# Patient Record
Sex: Male | Born: 1990 | Race: White | Hispanic: No | Marital: Married | Smoking: Current every day smoker
Health system: Southern US, Community
[De-identification: ages and names within clinical notes are randomized; demographics above are authoritative.]

## PROBLEM LIST (undated history)

## (undated) DIAGNOSIS — G43909 Migraine, unspecified, not intractable, without status migrainosus: Secondary | ICD-10-CM

## (undated) DIAGNOSIS — F17200 Nicotine dependence, unspecified, uncomplicated: Secondary | ICD-10-CM

## (undated) DIAGNOSIS — R000 Tachycardia, unspecified: Secondary | ICD-10-CM

## (undated) DIAGNOSIS — IMO0001 Reserved for inherently not codable concepts without codable children: Secondary | ICD-10-CM

## (undated) HISTORY — DX: Migraine, unspecified, not intractable, without status migrainosus: G43.909

## (undated) HISTORY — DX: Tachycardia, unspecified: R00.0

## (undated) HISTORY — DX: Nicotine dependence, unspecified, uncomplicated: F17.200

## (undated) HISTORY — DX: Reserved for inherently not codable concepts without codable children: IMO0001

---
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
## 2009-09-26 ENCOUNTER — Encounter: Payer: Self-pay | Admitting: Orthopedic Surgery

## 2009-09-26 ENCOUNTER — Emergency Department (HOSPITAL_COMMUNITY): Admission: EM | Admit: 2009-09-26 | Discharge: 2009-09-26 | Payer: Self-pay | Admitting: Emergency Medicine

## 2009-10-05 ENCOUNTER — Ambulatory Visit: Payer: Self-pay | Admitting: Orthopedic Surgery

## 2009-10-05 DIAGNOSIS — S8263XA Displaced fracture of lateral malleolus of unspecified fibula, initial encounter for closed fracture: Secondary | ICD-10-CM | POA: Insufficient documentation

## 2009-10-24 ENCOUNTER — Ambulatory Visit (HOSPITAL_COMMUNITY): Admission: RE | Admit: 2009-10-24 | Discharge: 2009-10-24 | Payer: Self-pay | Admitting: Orthopedic Surgery

## 2009-10-24 ENCOUNTER — Ambulatory Visit: Payer: Self-pay | Admitting: Orthopedic Surgery

## 2009-10-31 ENCOUNTER — Telehealth: Payer: Self-pay | Admitting: Orthopedic Surgery

## 2009-10-31 ENCOUNTER — Encounter: Payer: Self-pay | Admitting: Orthopedic Surgery

## 2009-11-21 ENCOUNTER — Ambulatory Visit: Payer: Self-pay | Admitting: Orthopedic Surgery

## 2010-05-02 NOTE — Assessment & Plan Note (Signed)
Summary: 4 WK RE-CK/XRAY LT ANKLE/BCBS/CAF   Visit Type:  Follow-up Referring Provider:  [facility]  CC:  left ankle fracture.  History of Present Illness: 20 year old male LEFT ankle fracture lateral malleolus  Treatment cast  Date of injury June 25  Patient's been in cast for 8 weeks  Today scheduled for x-rays  Current medications none  Xrays today.  clinical exam he is ambulatory with no limp he has no tenderness over the fracture there is no swelling.  Ankle range of motion is normal.  Plantar flexion dorsiflexion strength is normal.  His ankle is stable.  Has normal perfusion and ankle.  X-rays 3 views of the ankle fracture line is barely visible.  The mortise is intact.  Impression healed fracture( 95%)  Patient returned to work patient can remove air cast  Allergies: No Known Drug Allergies   Impression & Recommendations:  Problem # 1:  CLOSED FRACTURE OF LATERAL MALLEOLUS (ICD-824.2) Assessment Improved  Orders: Post-Op Check (84132) Ankle x-ray complete,  minimum 3 views (44010)  Problem # 2:  AFTERCARE HEALING TRAUMATIC FRACTURE LEG UNSPEC (ICD-V54.14) Assessment: Improved  Orders: Post-Op Check (27253) Ankle x-ray complete,  minimum 3 views (66440)  Patient Instructions: 1)  Please schedule a follow-up appointment as needed.
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
## 2010-05-02 NOTE — Letter (Signed)
Summary: History form  History form   Imported By: Jacklynn Ganong 10/07/2009 07:34:26  _____________________________________________________________________  External Attachment:    Type:   Image     Comment:   External Document
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
## 2010-05-02 NOTE — Progress Notes (Signed)
Summary: echo results  Phone Note Call from Patient Call back at [phone]   Caller: Patient Reason for Call: Talk to Nurse, Lab or Test Results Summary of Call: echo results 11-15-2008 Initial call taken by: Burnard Leigh,  November 22, 2008 4:27 PM  Follow-up for Phone Call        NOTE MAILED TO PT WITH ECHO RESULTS Scherrie Bateman, LPN  November 22, 2008 4:55 PM

## 2010-05-02 NOTE — Letter (Signed)
Summary: Out of Work  [facility]  [address]  [place], [place] [address]   Phone: [phone]  Fax: [phone]    November 21, 2009   Employee:  Christopher [name] [facility]    To Whom It May Concern:   [name] can return to work tomorrow Aug 23rd    Sincerely,    Fuller Canada MD

## 2010-05-02 NOTE — Letter (Signed)
Summary: Out of Work  [facility]  [address]  [place], [place] [address]   Phone: [phone]  Fax: [phone]    November 21, 2009   Employee:  Christopher [name] [employer]    To Whom It May Concern:   For Medical reasons, please excuse the above named employee from work for the following dates:  Start:    End:    If you need additional information, please feel free to contact our office.         Sincerely,    Fuller Canada MD

## 2010-05-02 NOTE — Assessment & Plan Note (Signed)
Summary: RE-CK/XRAYS LT ANKLE/FX CARE/BCBS?POSS NEW CARD/CAF   Visit Type:  Follow-up Referring Provider:  [facility]  CC:  fx care left ankle.  History of Present Illness: I saw Christopher [name] in the office today for an initial visit.  He is a 20 years old man with the complaint of:  left ankle fracture  DOI 09-24-09.  [facility] on [facility]. Xrays done there.  Medications: Ibuprofen 800mg , Hydrocodone 5/325  still complains of some pain in his ankle and also in his RIGHT hand I reviewed the x-rays of the RIGHT hand her negative  His ankle is doing well some mild discomfort laterally  Allergies: No Known Drug Allergies   Impression & Recommendations:  Problem # 1:  CLOSED FRACTURE OF LATERAL MALLEOLUS (ICD-824.2) Assessment Improved  new cell phone number:[phone]  X-rays of the LEFT ankle at [facility] show a healing fibula fracture, I called him with his result  Continue  Continue Aircast  Orders: Post-Op Check (09811)  Patient Instructions: 1)  4 weeks xray left ankle

## 2010-05-02 NOTE — Assessment & Plan Note (Signed)
Summary: [facility]   History of Present Illness: Christopher [name] is seen today at the request of the [facility] ER.  He was seen there on 7/27.  He had SSCP with palpitations.  Apparantly EMS idicated a HR of 200bpm.  The rythm spontaneously broke and was not documented.  The patient smokes 1ppd and had had 2-3 Red Bulls before the episode.  He is a Naval architect.  He denies other drugs or stiumlants.  He has not had a previos episodeo of palpitations or heart arrythmia.  There is no history of congenital heart disease, dyspne, syncope, or abnormal ECG.  There is no family history of WPW, HOCM, syncope, long QT or other cardiac problems.  I counseled the patient for less than 10 minutes on smoking cessations and suggested setting a quit date at the same time as his Christopher [name] who also smokes.  His "SVT" lasted about 45 minutes and was associated with right sided SSCP.  The pain was immediatly relieved when his heart rate normalized.  I reviewed his ER records.  Lab work was normal, tox screen negative, CXR normal with no cardiomegaly and normal ECG.  Since he has not had a previous episode and this one seems to have been precipitated by [employer] I don't think he needs a monitor or EPS evaluaiton  Current Problems (verified): None  Current Medications (verified): 1)  Imitrex 50 Mg Tabs (Sumatriptan Succinate) .... As Needed Migraines 2)  Vicodin 5-500 Mg Tabs (Hydrocodone-Acetaminophen) .... 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 Gets migrains on a weekly basis All other systems reviewed and negative   Vital Signs:  Patient profile:   20 year old male Pulse rate:   58 / minute Pulse rhythm:   regular Resp:     12 per minute BP supine:   120 / 68  Physical Exam  General:  Affect appropriate Healthy:  appears stated age HEENT: normal Neck supple with no adenopathy JVP normal no bruits no thyromegaly Lungs clear with no wheezing and good diaphragmatic motion Heart:  S1/S2 no murmur,rub, gallop or click PMI normal Abdomen: benighn, BS positve, no tenderness, no AAA no bruit.  No HSM or HJR Distal pulses intact with no bruits No edema Neuro non-focal Skin warm and dry Tatoos on back and right shoulder   EKG  Procedure date:  11/03/2008  Findings:      Sinus bradycardia 58 Minimal Voltage for LVH Borderline ECG PR on short side  Impression & Recommendations:  Problem # 1:  PALPITATIONS (ICD-785.1) ? episode of PSVT ppt by nicotine and caffien.  Avoid multiple cans of Red Bull.  Check echo to R/O structural heart disease  Problem # 2:  SMOKER (ICD-305.1) Recommend quit date with mom and 14mg  patch.    Problem # 3:  CHEST PAIN UNSPECIFIED (ICD-786.50) Lilkely related to PSVT.  F/u treadmill to R/O anomalous cors and exercised induced arrythmia  Problem # 4:  MENST MIGRAINE W/INTRACTBL W/STATUS MIGRAINOSUS (ICD-346.43) Recommend that he be  referred to neurologist given his frequency His updated medication list for this problem includes:    Imitrex 50 Mg Tabs (Sumatriptan succinate) .Marland Kitchen... As needed migraines    Vicodin 5-500 Mg Tabs (Hydrocodone-acetaminophen) .Marland Kitchen... As needed

## 2010-05-02 NOTE — Letter (Signed)
Summary: Xray order LT ankle  Xray order LT ankle   Imported By: Cammie Sickle 10/25/2009 11:04:52  _____________________________________________________________________  External Attachment:    Type:   Image     Comment:   External Document

## 2010-05-02 NOTE — Letter (Signed)
Summary: Generic Letter  [facility], Main Office  [address]   [place], [place] [address]   Phone: [phone]  Fax: [phone]        November 22, 2008 MRN: [id number]    [facility] [address] [place], [place]  [address]    Dear Christopher [name],            The ultrasound you had in our office was normal. That means the heart pumping function and the heart structure is normal. Please call with questions or concerns.     Sincerely,  Deliah Goody, RN/Dr Charlton Haws  This letter has been electronically signed by your physician.

## 2010-05-02 NOTE — Progress Notes (Signed)
  Patient called here today and was asking for a Back to Work/School Letter I printed one off got Rene Kocher FLowers to sign it and faxed to the patient @ [phone] [facility] # for patient [phone]  [facility]  November 15, 2008 1:14 PM

## 2010-05-02 NOTE — Letter (Signed)
Summary: [facility]  [address]  [place], [place] [address]   Phone: [phone]  Fax: [phone]    Today's Date: October 05, 2009  Name of Patient: Christopher [name]  The above named patient had a medical visit in our office today.  Please take this into consideration when reviewing the time away from work.   Special Instructions:      Arly.Keller ] To be off as follows:   START DATE:                     09/26/09  END/RETURN TO WORK:  10/10/09, Light duty as stated  [ X ] Other * LIGHT DUTY WORK, must sit  Next appointment on 10/24/09 ________________________________________________________________________   Sincerely,   Terrance Mass, MD

## 2010-07-09 LAB — RAPID URINE DRUG SCREEN, HOSP PERFORMED
Amphetamines: NOT DETECTED
Barbiturates: NOT DETECTED
Benzodiazepines: NOT DETECTED
Cocaine: NOT DETECTED
Opiates: POSITIVE — AB
Tetrahydrocannabinol: NOT DETECTED

## 2010-07-09 LAB — BASIC METABOLIC PANEL
BUN: 8 mg/dL (ref 6–23)
CO2: 30 mEq/L (ref 19–32)
Calcium: 9.8 mg/dL (ref 8.4–10.5)
Chloride: 105 mEq/L (ref 96–112)
Creatinine, Ser: 0.83 mg/dL (ref 0.4–1.5)
GFR calc Af Amer: >60 mL/min (ref >60)
GFR calc non Af Amer: >60 mL/min (ref >60)
Glucose, Bld: 110 mg/dL — ABNORMAL HIGH (ref 70–99)
Potassium: 3.4 mEq/L — ABNORMAL LOW (ref 3.5–5.1)
Sodium: 139 mEq/L (ref 135–145)

## 2010-07-09 LAB — TROPONIN I: Troponin I: 0.02 ng/mL (ref 0.00–0.06)

## 2010-07-09 LAB — CBC
HCT: 40.9 % (ref 39.0–52.0)
Hemoglobin: 14.6 g/dL (ref 13.0–17.0)
MCHC: 35.7 g/dL (ref 30.0–36.0)
MCV: 84.8 fL (ref 78.0–100.0)
Platelets: 199 10*3/uL (ref 150–400)
RBC: 4.82 MIL/uL (ref 4.22–5.81)
RDW: 13.6 % (ref 11.5–15.5)
WBC: 10.7 10*3/uL — ABNORMAL HIGH (ref 4.0–10.5)

## 2010-07-09 LAB — MAGNESIUM: Magnesium: 2.1 mg/dL (ref 1.5–2.5)

## 2010-07-09 LAB — POCT CARDIAC MARKERS
CKMB, poc: <1 ng/mL — ABNORMAL LOW (ref 1.0–8.0)
Myoglobin, poc: 35.9 ng/mL (ref 12–200)
Troponin i, poc: <0.05 ng/mL (ref 0.00–0.09)

## 2010-07-09 LAB — CK TOTAL AND CKMB (NOT AT ARMC)
CK, MB: 0.6 ng/mL (ref 0.3–4.0)
Relative Index: 0.3 (ref 0.0–2.5)
Total CK: 174 U/L (ref 7–232)

## 2010-07-09 LAB — ETHANOL: Alcohol, Ethyl (B): <5 mg/dL (ref 0–10)

## 2011-02-26 ENCOUNTER — Encounter: Payer: Self-pay | Admitting: Cardiovascular Disease
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
## 2014-02-22 ENCOUNTER — Encounter (HOSPITAL_COMMUNITY): Payer: Self-pay

## 2014-02-22 ENCOUNTER — Emergency Department (HOSPITAL_COMMUNITY): Payer: Worker's Compensation

## 2014-02-22 ENCOUNTER — Emergency Department (HOSPITAL_COMMUNITY)
Admission: EM | Admit: 2014-02-22 | Discharge: 2014-02-22 | Disposition: A | Discharge status: Home / Self Care | Payer: Worker's Compensation | Attending: Emergency Medicine | Admitting: Emergency Medicine

## 2014-02-22 DIAGNOSIS — T1490XA Injury, unspecified, initial encounter: Secondary | ICD-10-CM

## 2014-02-22 DIAGNOSIS — S61215A Laceration without foreign body of left ring finger without damage to nail, initial encounter: Secondary | ICD-10-CM | POA: Insufficient documentation

## 2014-02-22 DIAGNOSIS — Y9389 Activity, other specified: Secondary | ICD-10-CM | POA: Insufficient documentation

## 2014-02-22 DIAGNOSIS — G43909 Migraine, unspecified, not intractable, without status migrainosus: Secondary | ICD-10-CM | POA: Diagnosis not present

## 2014-02-22 DIAGNOSIS — Y9289 Other specified places as the place of occurrence of the external cause: Secondary | ICD-10-CM | POA: Diagnosis not present

## 2014-02-22 DIAGNOSIS — Y990 Civilian activity done for income or pay: Secondary | ICD-10-CM | POA: Diagnosis not present

## 2014-02-22 DIAGNOSIS — Z720 Tobacco use: Secondary | ICD-10-CM | POA: Insufficient documentation

## 2014-02-22 DIAGNOSIS — W230XXA Caught, crushed, jammed, or pinched between moving objects, initial encounter: Secondary | ICD-10-CM | POA: Insufficient documentation

## 2014-02-22 DIAGNOSIS — S61219A Laceration without foreign body of unspecified finger without damage to nail, initial encounter: Secondary | ICD-10-CM

## 2014-02-22 DIAGNOSIS — S61213A Laceration without foreign body of left middle finger without damage to nail, initial encounter: Secondary | ICD-10-CM | POA: Insufficient documentation

## 2014-02-22 DIAGNOSIS — Z23 Encounter for immunization: Secondary | ICD-10-CM | POA: Diagnosis not present

## 2014-02-22 MED ORDER — CEPHALEXIN 500 MG PO CAPS: 500.0000 mg | ORAL_CAPSULE | Freq: Four times a day (QID) | ORAL | Status: DC

## 2014-02-22 MED ORDER — TETANUS-DIPHTH-ACELL PERTUSSIS 5-2.5-18.5 LF-MCG/0.5 IM SUSP
0.5000 mL | Freq: Once | INTRAMUSCULAR | Status: AC
  Administered 2014-02-22: 0.5 mL via INTRAMUSCULAR
  Filled 2014-02-22: qty 0.5

## 2014-02-22 NOTE — ED Provider Notes (Signed)
CSN: [id number]     Arrival date & time 02/22/14  0820 History   First MD Initiated Contact with Patient 02/22/14 [phone]     Chief Complaint  Patient presents with  . Laceration     (Consider location/radiation/quality/duration/timing/severity/associated sxs/prior Treatment) HPI   Christopher [name] is a 23 y.o. male who presents to the Emergency Department complaining of laceration to the left middle and ring fingers that occurred at his place of work. He states that he was using a piece of equipment when his fingers became lodged between 2 pieces of metal. He reports a tingling sensation to his left middle finger. He has not tried any therapies or cleaning of the wound prior to ER arrival. He denies swelling, inability to flex or extend his fingers, or pain to his hand or wrist. Last tetanus vaccination is unknown.   Past Medical History  Diagnosis Date  . Smoking   . Migraines   . Tachycardia     ER visit 10/26/2008, heart rate 200 not documented   History reviewed. No pertinent past surgical history. Family History  Problem Relation Age of Onset  . Heart attack Father    History  Substance Use Topics  . Smoking status: Current Every Day Smoker -- 1.00 packs/day  . Smokeless tobacco: Not on file  . Alcohol Use: No    Review of Systems  Constitutional: Negative for fever and chills.  Musculoskeletal: Negative for back pain, joint swelling and arthralgias.  Skin: Positive for wound.       Laceration   Neurological: Negative for dizziness, weakness and numbness.  Hematological: Does not bruise/bleed easily.  All other systems reviewed and are negative.     Allergies  Review of patient's allergies indicates no known allergies.  Home Medications   Prior to Admission medications   Medication Sig Start Date End Date Taking? Authorizing Provider  HYDROcodone-acetaminophen (NORCO) 5-325 MG per tablet Take 1 tablet by mouth every 4 (four) hours as needed.      Historical  Provider, MD  HYDROcodone-acetaminophen (VICODIN) 5-500 MG per tablet Take 1 tablet by mouth as needed.      Historical Provider, MD  SUMAtriptan (IMITREX) 50 MG tablet Take 50 mg by mouth as needed.      Historical Provider, MD   BP 119/79 mmHg  Pulse 66  Temp(Src) 97.9 F (36.6 C) (Oral)  Resp 20  Ht 6' (1.829 m)  Wt 175 lb (79.379 kg)  BMI 23.73 kg/m2  SpO2 100% Physical Exam  Constitutional: He is oriented to person, place, and time. He appears well-developed and well-nourished. No distress.  HENT:  Head: Normocephalic and atraumatic.  Cardiovascular: Normal rate, regular rhythm, normal heart sounds and intact distal pulses.   No murmur heard. Pulmonary/Chest: Effort normal and breath sounds normal. No respiratory distress.  Musculoskeletal: He exhibits no edema or tenderness.  Neurological: He is alert and oriented to person, place, and time. He exhibits normal muscle tone. Coordination normal.  Skin: Skin is warm. Laceration noted.  11/2 cm laceration to the dorsal left middle finger. Bleeding is controlled. There is also a superficial laceration to the medial aspect of the left ring finger. Patient has full range of motion of all the fingers, distal sensation and radial pulse intact. Cap refill less than 2 seconds. No edema  Nursing note and vitals reviewed.   ED Course  Procedures (including critical care time) Labs Review Labs Reviewed - No data to display  Imaging Review Dg Hand Complete Left  02/22/2014   CLINICAL DATA:  Fingers were cut in a machine.  Injury.  EXAM: LEFT HAND - COMPLETE 3+ VIEW  COMPARISON:  None.  FINDINGS: Negative for a fracture or dislocation. No evidence for a radiopaque foreign body. Alignment of the left hand is normal.  IMPRESSION: No acute bone abnormality.   Electronically Signed   By: Richarda OverlieAdam  Henn M.D.   On: 02/22/2014 08:48     EKG Interpretation None      MDM   Final diagnoses:  Finger laceration, initial encounter    Patient  with 2 small lacerations to the left fingers, neurovascularly intact, patient has full range of motion. No concerning symptoms for muscle tendon or nerve injury.  TD updated. Patient requests that the lacerations not be closed .  I advised patient of risk for infection, he verbalizes understanding and agrees to return for any worsening symptoms.  Wounds were cleaned and bandages applied.  Rx for keflex given prophylactically  Aydin Cavalieri L. Trisha Mangleriplett, PA-C 02/22/14 0915  Donnetta HutchingBrian Cook, MD 02/22/14 1258

## 2014-02-22 NOTE — ED Notes (Signed)
Pt reports was working on a machine at work and the machine turned on and cut left middle finger in 2 places and small place on ring finger and abrasion on index finger.  Bleeding controlled.  Capillary refill wnl and pt has sensation to all fingers.  Reports tingling sensation in left middle finger.

## 2014-02-22 NOTE — Discharge Instructions (Signed)

## 2014-07-20 ENCOUNTER — Ambulatory Visit (INDEPENDENT_AMBULATORY_CARE_PROVIDER_SITE_OTHER): Payer: 59 | Admitting: Family Medicine

## 2014-07-20 ENCOUNTER — Encounter: Payer: Self-pay | Admitting: Family Medicine

## 2014-07-20 VITALS — BP 122/80 | Temp 98.2°F | Ht 73.0 in | Wt 181.0 lb

## 2014-07-20 DIAGNOSIS — R000 Tachycardia, unspecified: Secondary | ICD-10-CM | POA: Diagnosis not present
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
## 2014-08-03 ENCOUNTER — Encounter: Payer: Self-pay | Admitting: Internal Medicine

## 2014-08-03 ENCOUNTER — Ambulatory Visit (INDEPENDENT_AMBULATORY_CARE_PROVIDER_SITE_OTHER): Payer: 59 | Admitting: Internal Medicine

## 2014-08-03 VITALS — BP 116/70 | HR 76 | Ht 73.0 in | Wt 184.0 lb

## 2014-08-03 DIAGNOSIS — R002 Palpitations: Secondary | ICD-10-CM | POA: Diagnosis not present

## 2014-08-03 DIAGNOSIS — F17200 Nicotine dependence, unspecified, uncomplicated: Secondary | ICD-10-CM

## 2014-08-03 DIAGNOSIS — Z720 Tobacco use: Secondary | ICD-10-CM

## 2014-08-03 NOTE — Patient Instructions (Signed)
Medication Instructions:  None ordered  Labwork: None ordered  Testing/Procedures: None ordered  Follow-Up: Your physician recommends that you schedule a follow-up appointment as needed   Any Other Special Instructions Will Be Listed Below (If Applicable).  Please try and capture SVT on monitor Call if you are able to do so

## 2014-08-04 NOTE — Assessment & Plan Note (Signed)
We discussed the importance of smoking cessation. He has recently started using smokeless tobacco instead of cigarettes.

## 2014-08-04 NOTE — Assessment & Plan Note (Signed)
The etiology of his palpitations is unclear. He notes that his palpitations have improved with stopping caffeine. We discussed the possible etiologies. He could have SVT. As he works as a Company secretaryfireman, I have asked the patient to have a cardiac rhythm strip obtained when he feels like his heart is racing. If he does have SVT then medical therapy would first be indicated. We briefly discussed catheter ablation as well.
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
## 2014-08-11 ENCOUNTER — Encounter: Payer: Self-pay | Admitting: Family Medicine

## 2014-10-25 ENCOUNTER — Encounter: Payer: Self-pay | Admitting: Family Medicine

## 2014-10-25 ENCOUNTER — Ambulatory Visit (INDEPENDENT_AMBULATORY_CARE_PROVIDER_SITE_OTHER): Payer: 59 | Admitting: Family Medicine

## 2014-10-25 VITALS — BP 130/88 | Temp 98.2°F | Ht 73.0 in | Wt 189.0 lb

## 2014-10-25 DIAGNOSIS — H0016 Chalazion left eye, unspecified eyelid: Secondary | ICD-10-CM

## 2014-10-25 MED ORDER — DOXYCYCLINE HYCLATE 100 MG PO CAPS: 100.0000 mg | ORAL_CAPSULE | Freq: Two times a day (BID) | ORAL | Status: DC

## 2014-10-25 NOTE — Progress Notes (Signed)
   Subjective:    Patient ID: Christopher [name], male    DOB: 09-Jun-1990, 24 y.o.   MRN: [id number]  Eye Pain  The left eye is affected. This is a new problem. The current episode started in the past 7 days. The problem occurs constantly. The problem has been unchanged. There was no injury mechanism. The pain is moderate. Associated symptoms include eye redness. Associated symptoms comments: Bump on eyelid. He has tried water for the symptoms. The treatment provided no relief.  Patient has no other concerns at this time.     Review of Systems  Eyes: Positive for pain and redness.   No fever relates some headaches with it no vomiting    Objective:   Physical Exam  This appears to be a small infected gland on the eyelid I find no evidence of orbital cellulitis      Assessment & Plan:  Regional eyelid infection antibiotics warm compresses if not improving over the next 2-3 days referral to ophthalmology for drainage

## 2015-01-25 ENCOUNTER — Encounter (HOSPITAL_COMMUNITY): Payer: Self-pay | Admitting: Emergency Medicine

## 2015-01-25 ENCOUNTER — Emergency Department (HOSPITAL_COMMUNITY)
Admission: EM | Admit: 2015-01-25 | Discharge: 2015-01-26 | Disposition: A | Discharge status: Home / Self Care | Payer: Worker's Compensation | Attending: Emergency Medicine | Admitting: Emergency Medicine

## 2015-01-25 DIAGNOSIS — F17210 Nicotine dependence, cigarettes, uncomplicated: Secondary | ICD-10-CM | POA: Insufficient documentation

## 2015-01-25 DIAGNOSIS — Z77098 Contact with and (suspected) exposure to other hazardous, chiefly nonmedicinal, chemicals: Secondary | ICD-10-CM | POA: Diagnosis present

## 2015-01-25 DIAGNOSIS — Z139 Encounter for screening, unspecified: Secondary | ICD-10-CM

## 2015-01-25 DIAGNOSIS — Z008 Encounter for other general examination: Secondary | ICD-10-CM | POA: Insufficient documentation

## 2015-01-25 DIAGNOSIS — Z8679 Personal history of other diseases of the circulatory system: Secondary | ICD-10-CM | POA: Diagnosis not present

## 2015-01-25 DIAGNOSIS — Z792 Long term (current) use of antibiotics: Secondary | ICD-10-CM | POA: Diagnosis not present
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
## 2015-01-26 ENCOUNTER — Emergency Department (HOSPITAL_COMMUNITY): Payer: Worker's Compensation

## 2015-01-26 NOTE — Discharge Instructions (Signed)
Medical Screening Exam °A medical screening exam has been done. This exam helps find the cause of your problem and determines whether you need emergency treatment. Your exam has shown that you do not need emergency treatment at this point. It is safe for you to go to your caregiver's office or clinic for treatment. You should make an appointment today to see your caregiver as soon as he or she is available. °Depending on your illness, your symptoms and condition can change over time. If your condition gets worse or you develop new or troubling symptoms before you see your caregiver, you should return to the emergency department for further evaluation.  °  °This information is not intended to replace advice given to you by your health care provider. Make sure you discuss any questions you have with your health care provider. °  °Document Released: 04/26/2004 Document Revised: 04/09/2014 Document Reviewed: 12/06/2010 °Elsevier Interactive Patient Education ©2016 [employer] ° °

## 2015-01-26 NOTE — ED Notes (Signed)
Pt transported to radiology.

## 2015-08-26 ENCOUNTER — Ambulatory Visit (INDEPENDENT_AMBULATORY_CARE_PROVIDER_SITE_OTHER): Payer: 59 | Admitting: Family Medicine

## 2015-08-26 ENCOUNTER — Encounter: Payer: Self-pay | Admitting: Family Medicine

## 2015-08-26 VITALS — BP 126/82 | Ht 73.0 in | Wt 168.1 lb

## 2015-08-26 DIAGNOSIS — F410 Panic disorder [episodic paroxysmal anxiety] without agoraphobia: Secondary | ICD-10-CM | POA: Diagnosis not present

## 2015-08-26 MED ORDER — ALPRAZOLAM 0.25 MG PO TABS: 0.2500 mg | ORAL_TABLET | Freq: Two times a day (BID) | ORAL | Status: DC | PRN

## 2015-08-26 NOTE — Progress Notes (Signed)
   Subjective:    Patient ID: Christopher SessionsBrian [name], male    DOB: [birth year], 25 y.o.   MRN: [id number]  Anxiety Presents for initial visit. Onset was 1 to 4 weeks ago. The problem has been gradually worsening. Symptoms include chest pain, dizziness, nervous/anxious behavior, panic and shortness of breath. Symptoms occur occasionally. The quality of sleep is good.     Patient states no other concerns this visit. Patient denies being suicidal. Denies being depressed. States he's just more anxious with occasional panic attacks and at times feeling a little anxious and on edge  Review of Systems  Respiratory: Positive for shortness of breath.   Cardiovascular: Positive for chest pain.  Neurological: Positive for dizziness.  Psychiatric/Behavioral: The patient is nervous/anxious.        Objective:   Physical Exam Lungs clear heart regular pulse normal       Assessment & Plan:  Occasional panic attacks related to stress from a recent miscarriage in his family as well as stress at work he is trying to get into a less stressful position at work  He denies being depressed he states he will follow-up if having progressive troubles

## 2015-10-06 ENCOUNTER — Other Ambulatory Visit: Payer: Self-pay | Admitting: Family Medicine

## 2015-10-06 NOTE — Telephone Encounter (Signed)
1 refill 

## 2016-01-16 ENCOUNTER — Encounter: Payer: Self-pay | Admitting: Family Medicine

## 2016-01-16 ENCOUNTER — Ambulatory Visit (INDEPENDENT_AMBULATORY_CARE_PROVIDER_SITE_OTHER): Payer: 59 | Admitting: Family Medicine

## 2016-01-16 VITALS — BP 110/68 | Temp 98.3°F | Ht 73.0 in | Wt 170.2 lb

## 2016-01-16 DIAGNOSIS — M546 Pain in thoracic spine: Secondary | ICD-10-CM | POA: Diagnosis not present

## 2016-01-16 MED ORDER — CHLORZOXAZONE 500 MG PO TABS: 500.0000 mg | ORAL_TABLET | Freq: Three times a day (TID) | ORAL | 0 refills | Status: DC | PRN

## 2016-01-16 MED ORDER — ETODOLAC 400 MG PO TABS: 400.0000 mg | ORAL_TABLET | Freq: Two times a day (BID) | ORAL | 1 refills | Status: DC

## 2016-01-16 NOTE — Progress Notes (Signed)
   Subjective:    Patient ID: Christopher [name], male    DOB: March 04, 1991, 25 y.o.   MRN: [id number]  HPI Patient arrives with c/o upper right back pain-started gradually but got worse last night  Sat got some tables and picked them up and carried and sun carried further  strted on ed later   Aches and hurts  Supervisor  At [employer],     Review of Systems No cough no fever no vomiting no rash    Objective:   Physical Exam Alert vital stable no acute distress lungs clear heart rare rhythm positive right upper back tenderness to deep palpation. Scapular reason region       Assessment & Plan:  Impression acute trapezius strain plan local measures discussed. Anti-inflammatory medicine prescribed. Muscle spasm as prescribed. Symptom care discussed WSL no x-rays rationale discussed

## 2016-02-20 ENCOUNTER — Ambulatory Visit (HOSPITAL_COMMUNITY)
Admission: RE | Admit: 2016-02-20 | Discharge: 2016-02-20 | Disposition: A | Discharge status: Home / Self Care | Payer: 59 | Source: Ambulatory Visit | Attending: Family Medicine | Admitting: Family Medicine

## 2016-02-20 ENCOUNTER — Encounter: Payer: Self-pay | Admitting: Family Medicine

## 2016-02-20 ENCOUNTER — Ambulatory Visit (INDEPENDENT_AMBULATORY_CARE_PROVIDER_SITE_OTHER): Payer: 59 | Admitting: Family Medicine

## 2016-02-20 VITALS — BP 122/78 | Ht 73.0 in | Wt 172.8 lb

## 2016-02-20 DIAGNOSIS — S300XXA Contusion of lower back and pelvis, initial encounter: Secondary | ICD-10-CM | POA: Diagnosis not present
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
## 2016-06-14 ENCOUNTER — Other Ambulatory Visit: Payer: Self-pay | Admitting: Family Medicine

## 2016-06-14 NOTE — Telephone Encounter (Signed)
Last seen 02/2016 for Lumbar contusion

## 2017-06-12 ENCOUNTER — Telehealth: Payer: Self-pay | Admitting: Family Medicine

## 2017-06-12 DIAGNOSIS — Z13220 Encounter for screening for lipoid disorders: Secondary | ICD-10-CM

## 2017-06-12 DIAGNOSIS — Z79899 Other long term (current) drug therapy: Secondary | ICD-10-CM

## 2017-06-12 DIAGNOSIS — Z131 Encounter for screening for diabetes mellitus: Secondary | ICD-10-CM

## 2017-06-12 NOTE — Telephone Encounter (Signed)
Patient has wellness on 3/28 and needing labs done.

## 2017-06-13 NOTE — Telephone Encounter (Signed)
Lipid, liver, metabolic 7, CBC 

## 2017-06-13 NOTE — Telephone Encounter (Signed)
Blood work ordered in Epic. Patient notified. 

## 2017-06-20 LAB — CBC WITH DIFFERENTIAL/PLATELET
Basophils Absolute: 0 10*3/uL (ref 0.0–0.2)
Basos: 0 %
EOS (ABSOLUTE): 0.2 10*3/uL (ref 0.0–0.4)
Eos: 3 %
Hematocrit: 42.2 % (ref 37.5–51.0)
Hemoglobin: 14.9 g/dL (ref 13.0–17.7)
Immature Grans (Abs): 0 10*3/uL (ref 0.0–0.1)
Immature Granulocytes: 1 %
Lymphocytes Absolute: 2.2 10*3/uL (ref 0.7–3.1)
Lymphs: 34 %
MCH: 29.8 pg (ref 26.6–33.0)
MCHC: 35.3 g/dL (ref 31.5–35.7)
MCV: 84 fL (ref 79–97)
Monocytes Absolute: 0.5 10*3/uL (ref 0.1–0.9)
Monocytes: 7 %
Neutrophils Absolute: 3.5 10*3/uL (ref 1.4–7.0)
Neutrophils: 55 %
Platelets: 254 10*3/uL (ref 150–379)
RBC: 5 x10E6/uL (ref 4.14–5.80)
RDW: 13.4 % (ref 12.3–15.4)
WBC: 6.4 10*3/uL (ref 3.4–10.8)

## 2017-06-20 LAB — HEPATIC FUNCTION PANEL
ALT: 23 IU/L (ref 0–44)
AST: 23 IU/L (ref 0–40)
Albumin: 4.7 g/dL (ref 3.5–5.5)
Alkaline Phosphatase: 94 IU/L (ref 39–117)
Bilirubin Total: 0.4 mg/dL (ref 0.0–1.2)
Bilirubin, Direct: 0.12 mg/dL (ref 0.00–0.40)
Total Protein: 7.2 g/dL (ref 6.0–8.5)

## 2017-06-20 LAB — BASIC METABOLIC PANEL
BUN/Creatinine Ratio: 9 (ref 9–20)
BUN: 7 mg/dL (ref 6–20)
CO2: 25 mmol/L (ref 20–29)
Calcium: 9.4 mg/dL (ref 8.7–10.2)
Chloride: 101 mmol/L (ref 96–106)
Creatinine, Ser: 0.79 mg/dL (ref 0.76–1.27)
GFR calc Af Amer: 142 mL/min/{1.73_m2} (ref >59)
GFR calc non Af Amer: 123 mL/min/{1.73_m2} (ref >59)
Glucose: 83 mg/dL (ref 65–99)
Potassium: 4.3 mmol/L (ref 3.5–5.2)
Sodium: 138 mmol/L (ref 134–144)

## 2017-06-20 LAB — LIPID PANEL
Chol/HDL Ratio: 5.6 ratio — ABNORMAL HIGH (ref 0.0–5.0)
Cholesterol, Total: 196 mg/dL (ref 100–199)
HDL: 35 mg/dL — ABNORMAL LOW (ref >39)
LDL Calculated: 135 mg/dL — ABNORMAL HIGH (ref 0–99)
Triglycerides: 130 mg/dL (ref 0–149)
VLDL Cholesterol Cal: 26 mg/dL (ref 5–40)

## 2017-06-27 ENCOUNTER — Encounter: Payer: Self-pay | Admitting: Family Medicine

## 2017-06-27 ENCOUNTER — Ambulatory Visit (INDEPENDENT_AMBULATORY_CARE_PROVIDER_SITE_OTHER): Payer: BLUE CROSS/BLUE SHIELD | Admitting: Family Medicine

## 2017-06-27 VITALS — BP 124/78 | Ht 72.25 in | Wt 188.0 lb

## 2017-06-27 DIAGNOSIS — Z0000 Encounter for general adult medical examination without abnormal findings: Secondary | ICD-10-CM | POA: Diagnosis not present

## 2017-06-27 DIAGNOSIS — E7849 Other hyperlipidemia: Secondary | ICD-10-CM | POA: Diagnosis not present

## 2017-06-27 DIAGNOSIS — E785 Hyperlipidemia, unspecified: Secondary | ICD-10-CM | POA: Insufficient documentation

## 2017-06-27 MED ORDER — ALPRAZOLAM 0.25 MG PO TABS: ORAL_TABLET | ORAL | 1 refills | Status: DC

## 2017-06-27 MED ORDER — ALPRAZOLAM 0.25 MG PO TABS: ORAL_TABLET | ORAL | 0 refills | Status: DC

## 2017-06-27 NOTE — Progress Notes (Signed)
   Subjective:    Patient ID: Christopher [name], male    DOB: [birth year], 27 y.o.   MRN: [id number]  HPI The patient comes in today for a wellness visit.    A review of their health history was completed.  A review of medications was also completed.  Any needed refills; xanax  Eating habits: health conscious  Falls/  MVA accidents in past few months: none  Regular exercise: walks about 8 miles a day at work  Specialist pt sees on regular basis: none  Preventative health issues were discussed.   Additional concerns: a couple weeks ago. bp went up to 160/96 and stayed elevated for a couple of days.     Review of Systems  Constitutional: Negative for activity change, appetite change and fever.  HENT: Negative for congestion and rhinorrhea.   Eyes: Negative for discharge.  Respiratory: Negative for cough and wheezing.   Cardiovascular: Negative for chest pain.  Gastrointestinal: Negative for abdominal pain, blood in stool and vomiting.  Genitourinary: Negative for difficulty urinating and frequency.  Musculoskeletal: Negative for neck pain.  Skin: Negative for rash.  Allergic/Immunologic: Negative for environmental allergies and food allergies.  Neurological: Negative for weakness and headaches.  Psychiatric/Behavioral: Negative for agitation.       Objective:   Physical Exam  Constitutional: He appears well-developed and well-nourished.  HENT:  Head: Normocephalic and atraumatic.  Right Ear: External ear normal.  Left Ear: External ear normal.  Nose: Nose normal.  Mouth/Throat: Oropharynx is clear and moist.  Eyes: Right eye exhibits no discharge. Left eye exhibits no discharge. No scleral icterus.  Neck: Normal range of motion. Neck supple. No thyromegaly present.  Cardiovascular: Normal rate, regular rhythm and normal heart sounds.  No murmur heard. Pulmonary/Chest: Effort normal and breath sounds normal. No respiratory distress. He has no wheezes.  Abdominal: Soft.  Bowel sounds are normal. He exhibits no distension and no mass. There is no tenderness.  Genitourinary: Penis normal.  Musculoskeletal: Normal range of motion. He exhibits no edema.  Lymphadenopathy:    He has no cervical adenopathy.  Neurological: He is alert. He exhibits normal muscle tone. Coordination normal.  Skin: Skin is warm and dry. No erythema.  Psychiatric: He has a normal mood and affect. His behavior is normal. Judgment normal.     Prostate exam not indicated     Assessment & Plan:  Patient denies being depressed he just feels that he is stressed because of extra work he states he has 2 weeks of vacation coming up and feels very positive about it does not feel depressed patient was talked to at length about what to watch for and if he did feel depressed to follow-up immediately Prostate exam not indicated Adult wellness-complete.wellness physical was conducted today. Importance of diet and exercise were discussed in detail. In addition to this a discussion regarding safety was also covered. We also reviewed over immunizations and gave recommendations regarding current immunization needed for age. In addition to this additional areas were also touched on including: Preventative health exams needed: Colonoscopy not indicated  Patient was advised yearly wellness exam  Patient counseled to quit smoking he is tapering cigarettes  Does not drink  Cholesterol slightly elevated he will recheck this again in 1 years time

## 2017-09-18 ENCOUNTER — Other Ambulatory Visit: Payer: Self-pay | Admitting: Family Medicine

## 2017-09-18 NOTE — Telephone Encounter (Signed)
Recommend reducing the amount to 24 tablets 1/2-1 twice daily as needed anxiety caution drowsiness

## 2017-11-18 ENCOUNTER — Other Ambulatory Visit: Payer: Self-pay | Admitting: Family Medicine

## 2017-11-18 NOTE — Telephone Encounter (Signed)
months 1 refill, controlled medicine, needs follow-up office visit before further

## 2018-01-13 ENCOUNTER — Other Ambulatory Visit: Payer: Self-pay | Admitting: Family Medicine

## 2018-01-13 NOTE — Telephone Encounter (Signed)
1 refill 

## 2018-10-01 ENCOUNTER — Other Ambulatory Visit: Payer: Self-pay

## 2018-10-01 DIAGNOSIS — Z20822 Contact with and (suspected) exposure to covid-19: Secondary | ICD-10-CM

## 2018-10-08 LAB — NOVEL CORONAVIRUS, NAA: SARS-CoV-2, NAA: NOT DETECTED

## 2018-11-24 ENCOUNTER — Other Ambulatory Visit: Payer: Self-pay

## 2018-11-24 DIAGNOSIS — Z20822 Contact with and (suspected) exposure to covid-19: Secondary | ICD-10-CM

## 2018-11-25 LAB — NOVEL CORONAVIRUS, NAA: SARS-CoV-2, NAA: NOT DETECTED

## 2019-03-03 ENCOUNTER — Other Ambulatory Visit: Payer: Self-pay | Admitting: *Deleted

## 2019-03-03 DIAGNOSIS — Z20822 Contact with and (suspected) exposure to covid-19: Secondary | ICD-10-CM

## 2019-03-05 LAB — NOVEL CORONAVIRUS, NAA: SARS-CoV-2, NAA: NOT DETECTED
